# Patient Record
Sex: Male | Born: 1980 | Race: Black or African American | Hispanic: No | Marital: Single | Smoking: Current some day smoker
Health system: Southern US, Community
[De-identification: ages and names within clinical notes are randomized; demographics above are authoritative.]

## PROBLEM LIST (undated history)

## (undated) HISTORY — PX: ANKLE SURGERY: SHX546

---

## 2008-09-14 ENCOUNTER — Inpatient Hospital Stay: Payer: Self-pay | Admitting: Orthopedic Surgery
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
## 2017-10-19 ENCOUNTER — Encounter: Payer: Self-pay | Admitting: Emergency Medicine

## 2017-10-19 ENCOUNTER — Emergency Department: Payer: 59

## 2017-10-19 ENCOUNTER — Other Ambulatory Visit: Payer: Self-pay

## 2017-10-19 ENCOUNTER — Emergency Department
Admission: EM | Admit: 2017-10-19 | Discharge: 2017-10-19 | Disposition: A | Discharge status: Home / Self Care | Payer: 59 | Attending: Emergency Medicine | Admitting: Emergency Medicine

## 2017-10-19 DIAGNOSIS — F17210 Nicotine dependence, cigarettes, uncomplicated: Secondary | ICD-10-CM | POA: Insufficient documentation

## 2017-10-19 DIAGNOSIS — R1310 Dysphagia, unspecified: Secondary | ICD-10-CM | POA: Diagnosis not present

## 2017-10-19 DIAGNOSIS — R079 Chest pain, unspecified: Secondary | ICD-10-CM | POA: Diagnosis present

## 2017-10-19 LAB — BASIC METABOLIC PANEL
Anion gap: 8 (ref 5–15)
BUN: 14 mg/dL (ref 6–20)
CALCIUM: 9 mg/dL (ref 8.9–10.3)
CO2: 26 mmol/L (ref 22–32)
Chloride: 106 mmol/L (ref 101–111)
Creatinine, Ser: 1.07 mg/dL (ref 0.61–1.24)
Glucose, Bld: 66 mg/dL (ref 65–99)
Potassium: 4 mmol/L (ref 3.5–5.1)
SODIUM: 140 mmol/L (ref 135–145)

## 2017-10-19 LAB — LIPASE, BLOOD: Lipase: 24 U/L (ref 11–51)

## 2017-10-19 LAB — CBC
HCT: 42.2 % (ref 40.0–52.0)
HEMOGLOBIN: 14.1 g/dL (ref 13.0–18.0)
MCH: 31.8 pg (ref 26.0–34.0)
MCHC: 33.4 g/dL (ref 32.0–36.0)
MCV: 95.2 fL (ref 80.0–100.0)
Platelets: 238 10*3/uL (ref 150–440)
RBC: 4.43 MIL/uL (ref 4.40–5.90)
RDW: 12.8 % (ref 11.5–14.5)
WBC: 6.2 10*3/uL (ref 3.8–10.6)

## 2017-10-19 LAB — HEPATIC FUNCTION PANEL
ALBUMIN: 3.8 g/dL (ref 3.5–5.0)
ALT: 31 U/L (ref 17–63)
AST: 20 U/L (ref 15–41)
Alkaline Phosphatase: 44 U/L (ref 38–126)
Bilirubin, Direct: <0.1 mg/dL — ABNORMAL LOW (ref 0.1–0.5)
TOTAL PROTEIN: 7 g/dL (ref 6.5–8.1)
Total Bilirubin: 0.4 mg/dL (ref 0.3–1.2)

## 2017-10-19 LAB — TROPONIN I

## 2017-10-19 MED ORDER — GI COCKTAIL ~~LOC~~
30.0000 mL | Freq: Once | ORAL | Status: AC
  Administered 2017-10-19: 30 mL via ORAL
  Filled 2017-10-19: qty 30

## 2017-10-19 MED ORDER — FAMOTIDINE 20 MG PO TABS: 20.0000 mg | ORAL_TABLET | Freq: Every day | ORAL | 1 refills | Status: AC

## 2017-10-19 NOTE — ED Provider Notes (Signed)
[facility] Emergency Department Provider Note  ____________________________________________   I have reviewed the triage vital signs and the nursing notes. Where available I have reviewed prior notes and, if possible and indicated, outside hospital notes.    HISTORY  Chief Complaint Chest Pain    HPI Daniel L Denney Shein. is a 36 y.o. male at baseline healthy, does sometimes have a history of acid indigestion presents today complaining of discomfort when he swallows especially large things like pizza crust.  He has a burning sensation also.  If he is not actually swallowing food he has no pain.  Denies any fever chills nausea vomiting chest pain shortness of breath abdominal pain, he has no melena or bright red blood per rectum no hematemesis no diarrhea.  Symptoms have been going on off and on for a while but more pronouncedly over the last few days.  It is a burning discomfort.  Besides getting worse with food and better when he is not eating nothing makes it better or worse no other associated symptoms no prior treatment.  He has no exertional symptoms, he denies any personal family history of PE or DVT no recent travel no leg swelling, no exertional symptoms no pleuritic symptoms  History reviewed. No pertinent past medical history.  There are no active problems to display for this patient.   Past Surgical History:  Procedure Laterality Date  . ANKLE SURGERY      Prior to Admission medications   Not on File    Allergies Patient has no known allergies.  No family history on file.  Social History Social History   Tobacco Use  . Smoking status: Current Some Day Smoker  . Smokeless tobacco: Never Used  Substance Use Topics  . Alcohol use: Yes    Comment: occasionally  . Drug use: Not on file    Review of Systems Constitutional: No fever/chills Eyes: No visual changes. ENT: No sore throat. No stiff neck no neck pain Cardiovascular: Denies  chest pain. Respiratory: Denies shortness of breath. Gastrointestinal:   no vomiting.  No diarrhea.  No constipation. Genitourinary: Negative for dysuria. Musculoskeletal: Negative lower extremity swelling Skin: Negative for rash. Neurological: Negative for severe headaches, focal weakness or numbness.   ____________________________________________   PHYSICAL EXAM:  VITAL SIGNS: ED Triage Vitals  Enc Vitals Group     BP 10/19/17 1844 (!) 142/75     Pulse Rate 10/19/17 1844 86     Resp 10/19/17 1844 18     Temp 10/19/17 1844 98.3 F (36.8 C)     Temp Source 10/19/17 1844 Oral     SpO2 10/19/17 1844 100 %     Weight 10/19/17 1845 270 lb (122.5 kg)     Height 10/19/17 1845 5\' 10"  (1.778 m)     Head Circumference --      Peak Flow --      Pain Score 10/19/17 1844 7     Pain Loc --      Pain Edu? --      Excl. in GC? --     Constitutional: Alert and oriented. Well appearing and in no acute distress. Eyes: Conjunctivae are normal Head: Atraumatic HEENT: No congestion/rhinnorhea. Mucous membranes are moist.  Oropharynx non-erythematous Neck:   Nontender with no meningismus, no masses, no stridor Cardiovascular: Normal rate, regular rhythm. Grossly normal heart sounds.  Good peripheral circulation. Respiratory: Normal respiratory effort.  No retractions. Lungs CTAB. Abdominal: Soft and nontender. No distention. No guarding no rebound  Back:  There is no focal tenderness or step off.  there is no midline tenderness there are no lesions noted. there is no CVA tenderness Musculoskeletal: No lower extremity tenderness, no upper extremity tenderness. No joint effusions, no DVT signs strong distal pulses no edema Neurologic:  Normal speech and language. No gross focal neurologic deficits are appreciated.  Skin:  Skin is warm, dry and intact. No rash noted. Psychiatric: Mood and affect are normal. Speech and behavior are normal.  ____________________________________________    LABS (all labs ordered are listed, but only abnormal results are displayed)  Labs Reviewed  HEPATIC FUNCTION PANEL - Abnormal; Notable for the following components:      Result Value   Bilirubin, Direct <0.1 (*)    All other components within normal limits  BASIC METABOLIC PANEL  CBC  TROPONIN I  LIPASE, BLOOD    Pertinent labs  results that were available during my care of the patient were reviewed by me and considered in my medical decision making (see chart for details). ____________________________________________  EKG  I personally interpreted any EKGs ordered by me or triage Normal sinus rhythm rate 80 bpm no acute ST elevation or depression, no acute ischemic change normal axis ____________________________________________  RADIOLOGY  Pertinent labs & imaging results that were available during my care of the patient were reviewed by me and considered in my medical decision making (see chart for details). If possible, patient and/or family made aware of any abnormal findings.  Dg Chest 2 View  Result Date: 10/19/2017 CLINICAL DATA:  Chest pain EXAM: CHEST  2 VIEW COMPARISON:  None. FINDINGS: Normal heart size and mediastinal contours. No acute infiltrate or edema. No effusion or pneumothorax. No acute osseous findings. IMPRESSION: Negative chest. Electronically Signed   By: Marnee SpringJonathon  Watts M.D.   On: 10/19/2017 19:05   ____________________________________________    PROCEDURES  Procedure(s) performed: None  Procedures  Critical Care performed: None  ____________________________________________   INITIAL IMPRESSION / ASSESSMENT AND PLAN / ED COURSE  Pertinent labs & imaging results that were available during my care of the patient were reviewed by me and considered in my medical decision making (see chart for details).  She is here in no acute distress with discomfort when he eats food especially pizza, very reassuring workup despite symptoms since the  weekend, patient has a negative troponin and I do not think serial cardiac enzymes are indicated, liver function test lipase CBC h BMP and chest x-ray all are reassuring, we gave the patient a GI cocktail and he feels much better.  No evidence of GI bleed.  At this time, there does not appear to be clinical evidence to support the diagnosis of pulmonary embolus, dissection, myocarditis, endocarditis, pericarditis, pericardial tamponade, acute coronary syndrome, pneumothorax, pneumonia, or any other acute intrathoracic pathology that will require admission or acute intervention. Nor is there evidence of any significant intra-abdominal pathology causing this discomfort.  Considering the patient's symptoms, medical history, and physical examination today, I have low suspicion for cholecystitis or biliary pathology, pancreatitis, perforation or bowel obstruction, hernia, intra-abdominal abscess, AAA or dissection, volvulus or intussusception, mesenteric ischemia, ischemic gut, pyelonephritis or appendicitis.  We will have him closely follow-up with GI return precautions and follow-up given and understood patient very comfortable with this plan   ____________________________________________   FINAL CLINICAL IMPRESSION(S) / ED DIAGNOSES  Final diagnoses:  None      This chart was dictated using voice recognition software.  Despite best efforts to proofread,  errors  can occur which can change meaning.      Jeanmarie PlantMcShane, James A, MD 10/19/17 2128

## 2017-10-19 NOTE — ED Triage Notes (Signed)
Patient presents to the ED with epigastric pain/mid-sternal chest pain that has been intermittent since Friday.  Patient states pain radiates into his back.  Patient states pain is worse after eating.  Patient denies shortness of breath and nausea.  Patient states, "I thought it was heart burn so I took some medicine for that but it's not helping."  Patient reports pain as "burning".
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
## 2018-06-15 IMAGING — CR DG CHEST 2V
2 series · 2 of 2 positions shown · non-contrast
Comparison: None.

CLINICAL DATA: Chest pain

EXAM:
CHEST  2 VIEW

[chest pa]
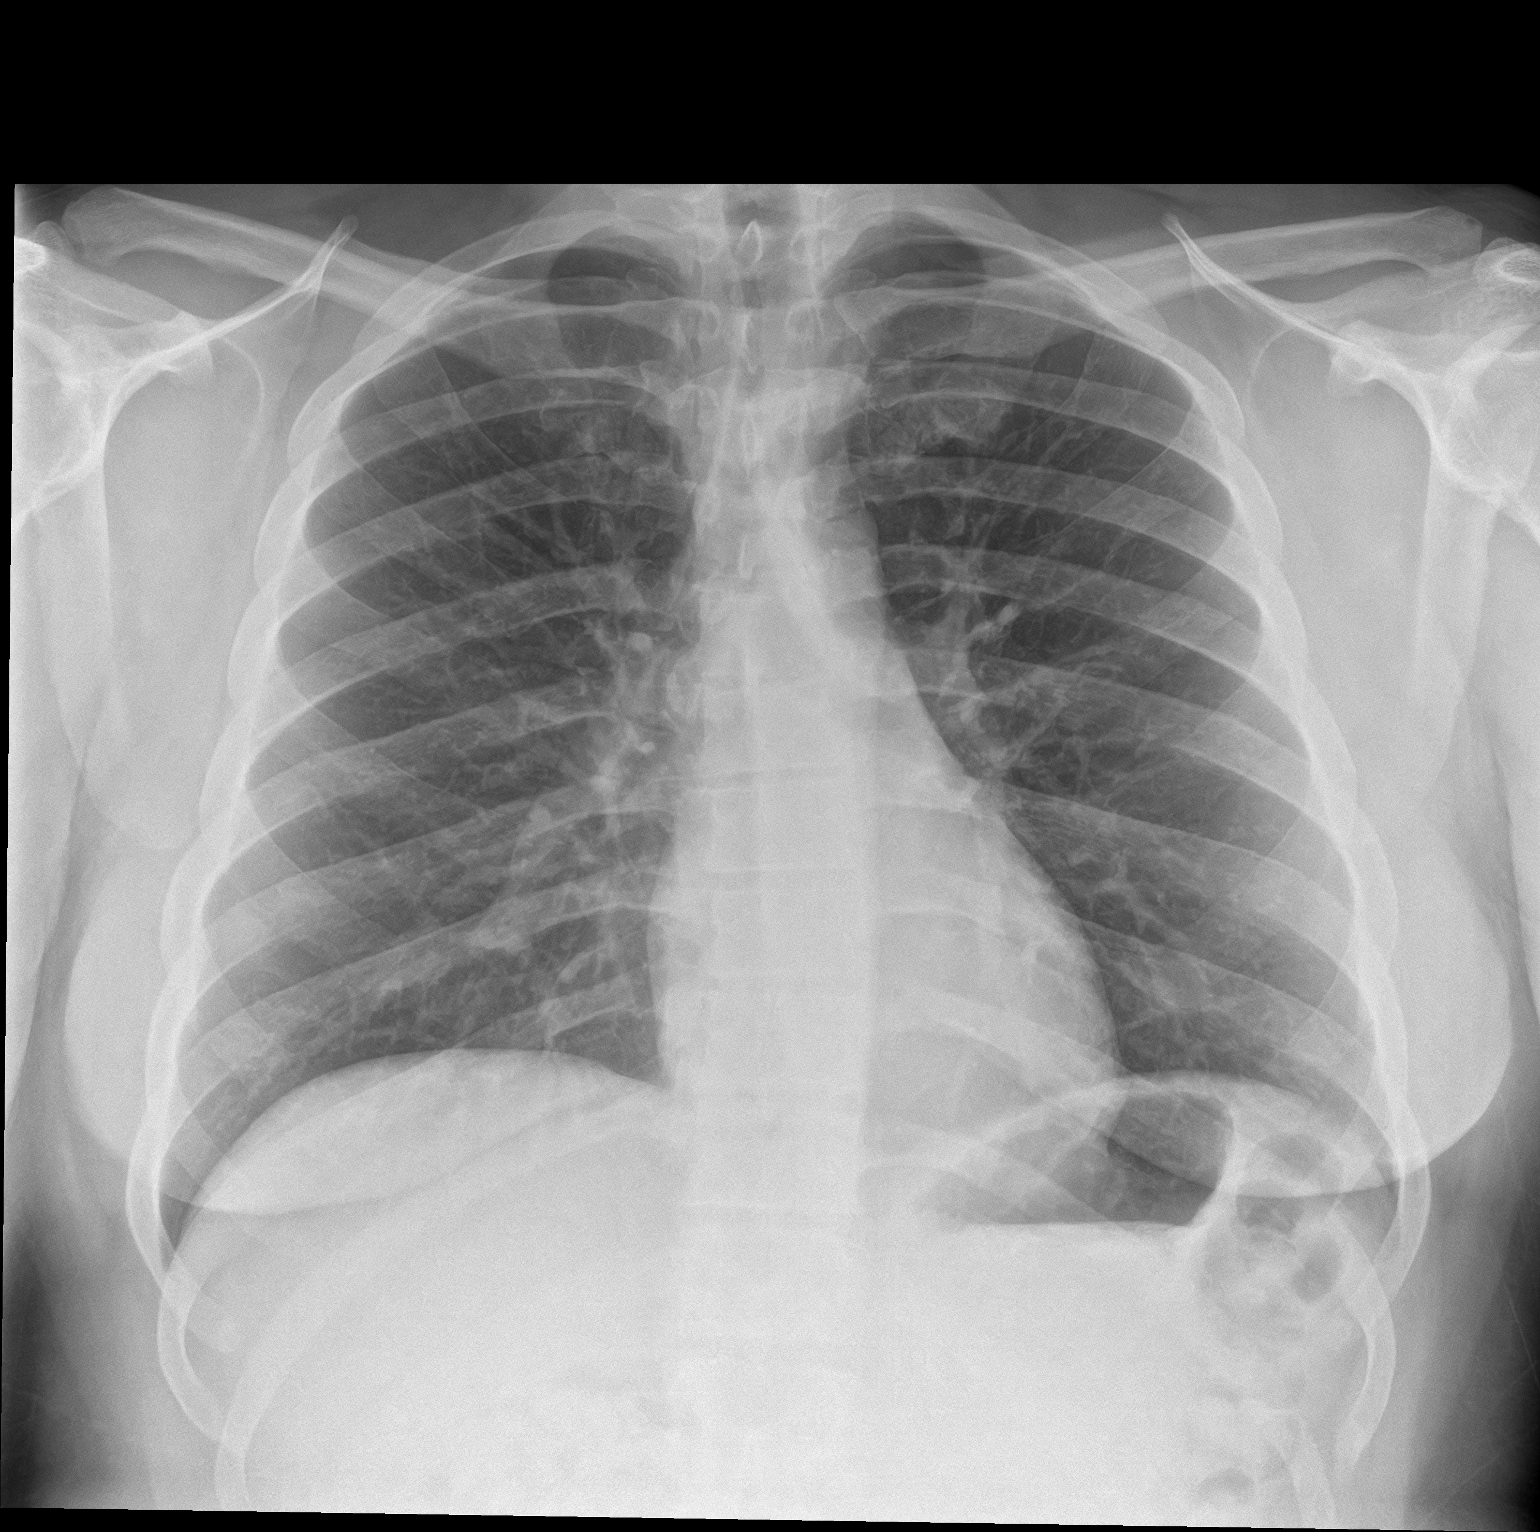

[chest lat]
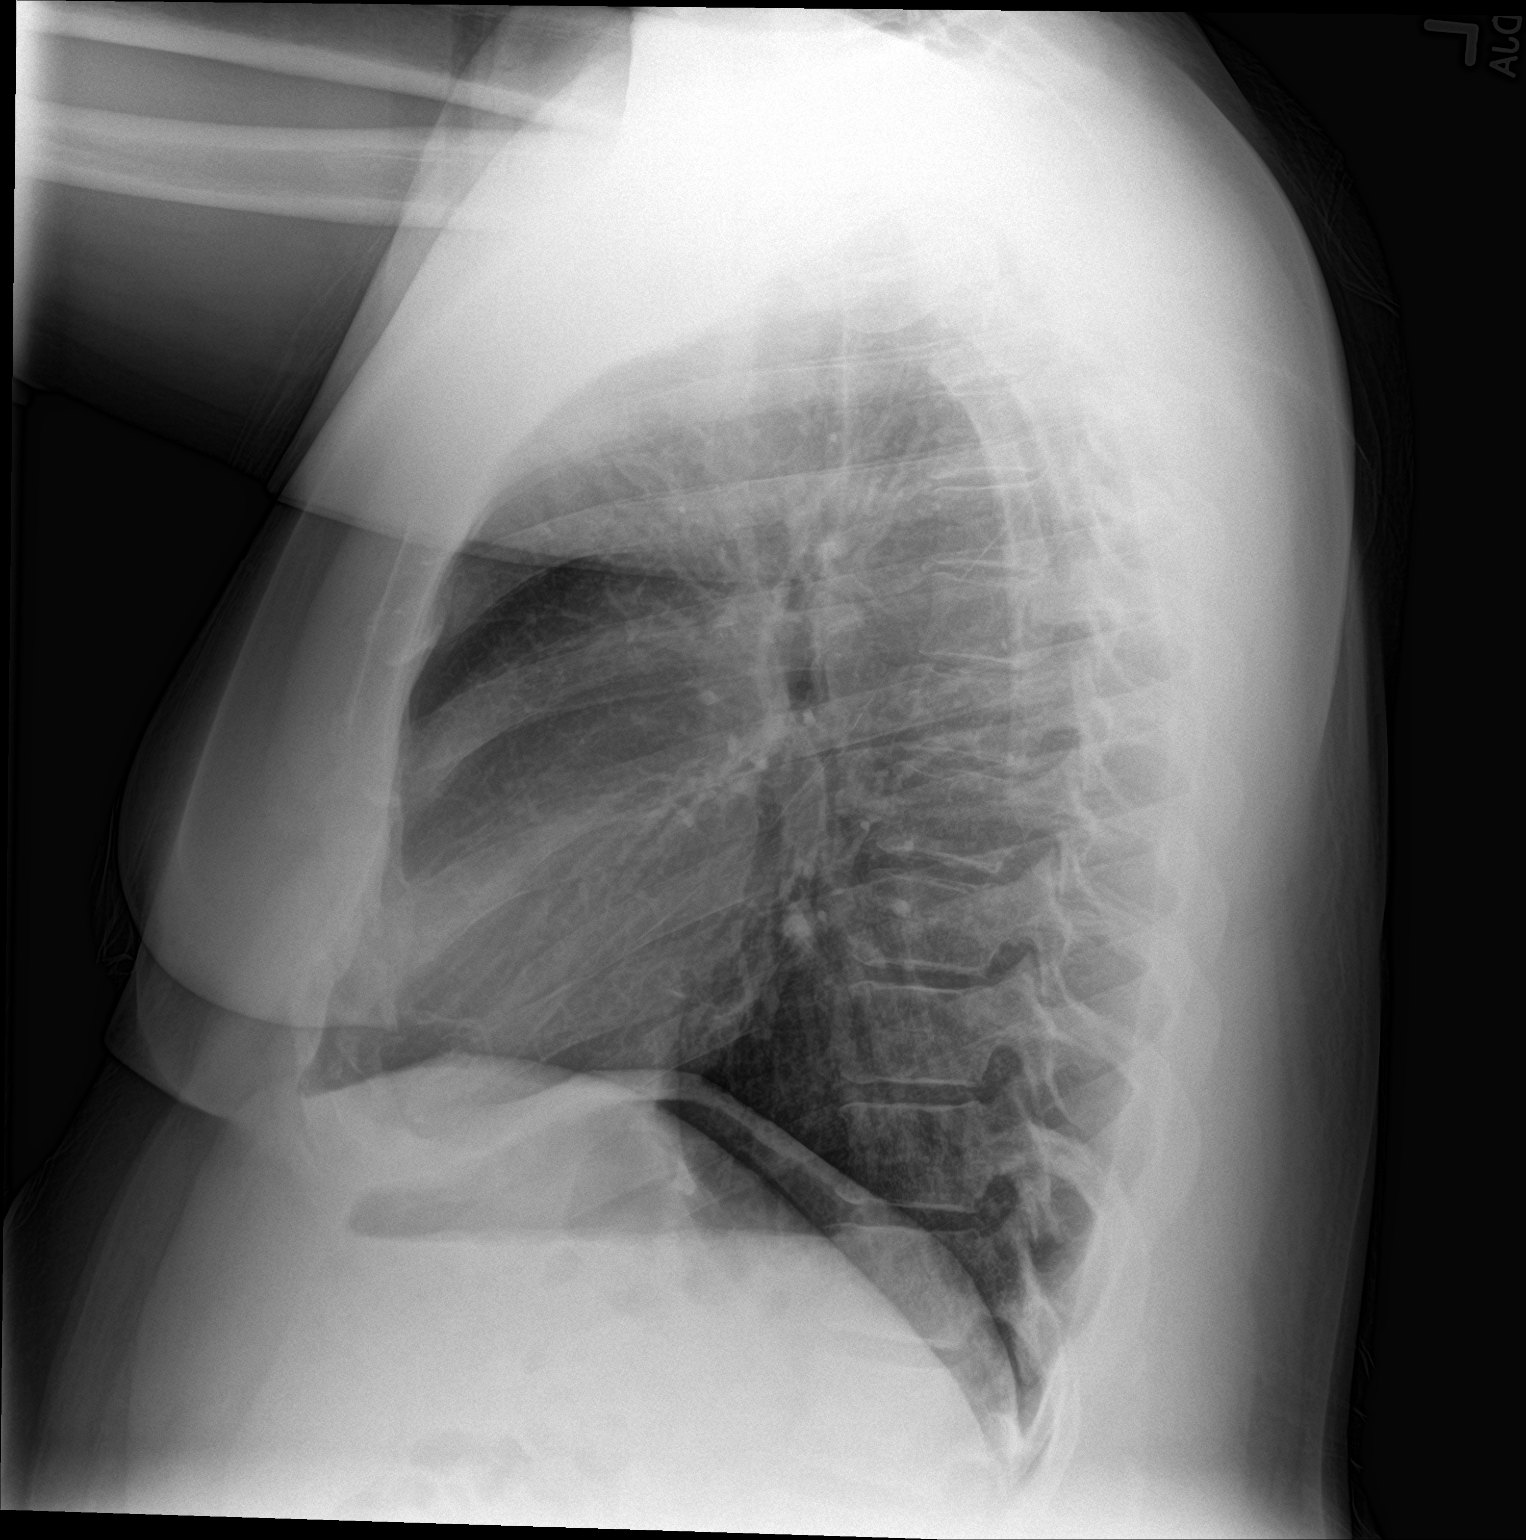

[2 of 2 positions shown; findings below may reference images not displayed]

FINDINGS: Normal heart size and mediastinal contours. No acute infiltrate or
edema. No effusion or pneumothorax. No acute osseous findings.
IMPRESSION: Negative chest.

## 2023-12-03 ENCOUNTER — Encounter: Payer: Self-pay | Admitting: Family Medicine

## 2023-12-03 ENCOUNTER — Ambulatory Visit: Payer: BC Managed Care – PPO | Admitting: Family Medicine

## 2023-12-03 DIAGNOSIS — Z113 Encounter for screening for infections with a predominantly sexual mode of transmission: Secondary | ICD-10-CM

## 2023-12-03 LAB — HM HIV SCREENING LAB: HM HIV Screening: NEGATIVE

## 2023-12-03 NOTE — Progress Notes (Signed)
[facility] Department STI clinic [address], [address] [place] [address] Main phone: [phone]  STI screening visit  Subjective:  Daniel Stanley. is a 43 y.o. male being seen today for an STI screening visit. The patient reports they do not have symptoms.    Patient has the following medical conditions:  There are no active problems to display for this patient.   Chief Complaint  Patient presents with   SEXUALLY TRANSMITTED DISEASE    Pt is here for STD screening and have no symptoms    HPI HPI Patient reports to clinic for STI testing- asymptomatic  Last HIV test per patient/review of record was No results found for: "HMHIVSCREEN" No results found for: "HIV"  Last HEPC test per patient/review of record was No results found for: "HMHEPCSCREEN" No components found for: "HEPC"   Last HEPB test per patient/review of record was No components found for: "HMHEPBSCREEN" No components found for: "HEPC"   Does the patient or their partner desires a pregnancy in the next year? No  Screening for MPX risk: Does the patient have an unexplained rash? No Is the patient MSM? No Does the patient endorse multiple sex partners or anonymous sex partners? No Did the patient have close or sexual contact with a person diagnosed with MPX? No Has the patient traveled outside the [place] where MPX is endemic? No Is there a high clinical suspicion for MPX-- evidenced by one of the following No  -Unlikely to be chickenpox  -Lymphadenopathy  -Rash that present in same phase of evolution on any given body part   See flowsheet for further details and programmatic requirements.    There is no immunization history on file for this patient.   The following portions of the patient's history were reviewed and updated as appropriate: allergies, current medications, past medical history, past social history, past surgical history and problem list.  Objective:  There  were no vitals filed for this visit.  Physical Exam Vitals and nursing note reviewed.  Constitutional:      Appearance: Normal appearance.  HENT:     Head: Normocephalic and atraumatic.     Mouth/Throat:     Mouth: Mucous membranes are moist.     Pharynx: No oropharyngeal exudate or posterior oropharyngeal erythema.  Eyes:     General:        Right eye: No discharge.        Left eye: No discharge.     Conjunctiva/sclera:     Right eye: Right conjunctiva is not injected. No exudate.    Left eye: Left conjunctiva is not injected. No exudate. Pulmonary:     Effort: Pulmonary effort is normal.  Abdominal:     General: Abdomen is flat.     Palpations: Abdomen is soft. There is no hepatomegaly or mass.     Tenderness: There is no abdominal tenderness. There is no rebound.  Genitourinary:    Comments: Declined genital exam- asymptomatic Lymphadenopathy:     Cervical: No cervical adenopathy.     Upper Body:     Right upper body: No supraclavicular or axillary adenopathy.     Left upper body: No supraclavicular or axillary adenopathy.  Skin:    General: Skin is warm and dry.  Neurological:     Mental Status: He is alert and oriented to person, place, and time.     Assessment and Plan:  Daniel Stanley. is a 43 y.o. male presenting to the  [facility] Department for STI screening  1. Screening for venereal disease (Primary)  - HIV [place] [facility] - Syphilis Serology, [place] [facility] - Chlamydia/GC NAA, Confirmation   Patient does not have STI symptoms Patient accepted all screenings including  urine GC/Chlamydia, and blood work for HIV/Syphilis. Patient meets criteria for HepB screening? No. Ordered? not applicable Patient meets criteria for HepC screening? No. Ordered? not applicable Recommended condom use with all sex Discussed importance of condom use for STI prevention  Treat positive test results per standing order. Discussed time line for [facility]  results and that patient will be called with positive results and encouraged patient to call if he had not heard in 2 weeks Recommended repeat testing in 3 months with positive results. Recommended returning for continued or worsening symptoms.   Return if symptoms worsen or fail to improve, for STI screening.  Future Appointments  Date Time Provider Department Center  12/03/2023  2:20 PM Lenice Llamas, FNP [facility] None    Lenice Llamas, [place]

## 2023-12-03 NOTE — Progress Notes (Signed)
Pt is here for STD screening. Condoms declined. Sonda Primes, RN.

## 2023-12-06 LAB — CHLAMYDIA/GC NAA, CONFIRMATION
Chlamydia trachomatis, NAA: NEGATIVE
Neisseria gonorrhoeae, NAA: NEGATIVE

## 2024-08-18 ENCOUNTER — Ambulatory Visit: Payer: Self-pay | Admitting: Cardiology
